# Patient Record
Sex: Male | Born: 1965 | Race: White | Hispanic: Yes | Marital: Married | Smoking: Never smoker
Health system: Southern US, Community
[De-identification: ages and names within clinical notes are randomized; demographics above are authoritative.]

## PROBLEM LIST (undated history)

## (undated) DIAGNOSIS — E785 Hyperlipidemia, unspecified: Secondary | ICD-10-CM

## (undated) HISTORY — PX: POLYPECTOMY: SHX149

## (undated) HISTORY — DX: Hyperlipidemia, unspecified: E78.5

## (undated) HISTORY — PX: SPINE SURGERY: SHX786

---

## 1999-03-28 HISTORY — PX: BACK SURGERY: SHX140

## 1999-04-20 ENCOUNTER — Encounter: Admission: RE | Admit: 1999-04-20 | Discharge: 1999-04-20 | Payer: Self-pay | Admitting: Family Medicine

## 1999-04-20 ENCOUNTER — Encounter: Payer: Self-pay | Admitting: Family Medicine

## 1999-09-08 ENCOUNTER — Encounter: Payer: Self-pay | Admitting: Neurosurgery

## 1999-09-12 ENCOUNTER — Inpatient Hospital Stay (HOSPITAL_COMMUNITY): Admission: RE | Admit: 1999-09-12 | Discharge: 1999-09-13 | Payer: Self-pay | Admitting: Neurosurgery

## 1999-09-12 ENCOUNTER — Encounter: Payer: Self-pay | Admitting: Neurosurgery

## 2003-03-28 HISTORY — PX: VASECTOMY: SHX75

## 2004-10-31 ENCOUNTER — Encounter: Admission: RE | Admit: 2004-10-31 | Discharge: 2004-10-31 | Payer: Self-pay | Admitting: Family Medicine

## 2005-10-20 ENCOUNTER — Ambulatory Visit: Payer: Self-pay | Admitting: Family Medicine

## 2005-10-26 ENCOUNTER — Ambulatory Visit: Payer: Self-pay

## 2005-10-26 ENCOUNTER — Encounter: Payer: Self-pay | Admitting: Cardiology

## 2006-04-06 ENCOUNTER — Ambulatory Visit: Payer: Self-pay

## 2006-04-06 ENCOUNTER — Ambulatory Visit: Payer: Self-pay | Admitting: Family Medicine

## 2006-06-08 ENCOUNTER — Ambulatory Visit: Payer: Self-pay | Admitting: Family Medicine

## 2007-06-20 ENCOUNTER — Ambulatory Visit: Payer: Self-pay | Admitting: Family Medicine

## 2008-06-23 ENCOUNTER — Ambulatory Visit: Payer: Self-pay | Admitting: Family Medicine

## 2008-06-30 ENCOUNTER — Ambulatory Visit: Payer: Self-pay | Admitting: Family Medicine

## 2010-01-10 ENCOUNTER — Ambulatory Visit: Payer: Self-pay | Admitting: Family Medicine

## 2010-08-12 NOTE — Op Note (Signed)
[place]. [facility]  Patient:    Gregory Ali, Gregory Ali                     MRN: [id number] Proc. Date: 09/12/99 Adm. Date:  [id number] Disc. Date: [id number] Attending:  Barton Fanny CC:         Hewitt Shorts, M.D.                           Operative Report  PREOPERATIVE DIAGNOSES:  Right L4-5 lumbar disk herniation, with spondylosis and degenerative disk disease.  POSTOPERATIVE DIAGNOSES:  Right L4-5 spondylosis and degenerative disk disease.  PROCEDURE:  Right L4-5 lumbar laminotomy and foraminotomy with microdiskectomy.  SURGEON:  Hewitt Shorts, M.D.  ASSISTANT:  Dr. Venetia Maxon.  ANESTHESIA:  General endotracheal.  INDICATIONS:  The patient is a 45 year old man who presented with a right lumbar radiculopathy who was found by MRI scan to have degenerative disk disease and spondylosis with what appeared to be a superimposed disc herniation.  The disc herniation migrated caudally below the L4-5 disc space level into the right L5-S1 foramen.  However, intraoperative findings revealed in fact that the disc was clearly degenerated and there was spondylosis and neurocompression but that an actual disc herniation was not present and therefore only laminotomy foraminotomy was performed for decompression.  DESCRIPTION OF PROCEDURE:  The patient was brought to the operating room and placed under general endotracheal anesthesia.  The patient was turned to a prone position and the lumbar region was prepped with Betadine soap solution, draped in a sterile fashion.  The midline was infiltrated with local anesthetic with epinephrine and local x-ray taken and the L4-5 level was identified.  A midline incision was made, carried down through the subcutaneous tissue.  Bipolar cautery and electrocautery used to maintain hemostasis.  Dissection was carried down to the lumbar fascia which was incised from the right side of the midline in the paraspinal muscle  with dissection of the spinous process and lamina in a subperiosteal fashion. Another x-ray was taken and the L4-5 interlaminar space identified.  The laminotomy was performed using the Midas Rex drill with an ______ bur and Kerrison punches.  The ligamentum flavum was removed and then the microscope was draped and brought into the field to provide identification, illumination and visualization and the remainder of the procedure was performed using microdissection technique.  We exposed the lateral aspect of the thecal sac and the right L5 nerve root. The laminotomy was carried caudally sufficiently to examine the axillae of the right L5 nerve root.  The disc space was identified, overlying epidural veins were coagulated.  The disc was clearly degenerated and bulging, but no actual disc fragment was found.  We examined the epidural space ventral to the thecal sac and ventral to the nerve root and extended our examination into the right L5-S1 foramen.  It was felt that the thecal sac and nerve root were well decompressed and that no disc herniation and in particular no free fragment was encountered.  Therefore, no discectomy was performed.  Once decompression was established and hemostasis was established we proceeded with closure.  Prior to closure though, 2 cc of fentanyl and a small graft of fat was placed in the laminotomy defect and then we proceeded with closure.  The deep fascia was closed with interrupted undyed 0 Vicryl sutures and the subcutaneous and subcuticular were closed with four interrupted and inverted 2-0  Vicryl sutures, then the skin edges were approximately with Dermabond. The patient tolerated the procedure well.  ESTIMATED BLOOD LOSS:  100 cc.  INSTRUMENT COUNTS:  Sponge and count were correct.  Following surgery, the patient was turned back to the supine position to be reversed from the anesthetic, extubated and was transferred to the recovery room for  further care. DD:  09/12/99 TD:  09/14/99 Job: [id number]

## 2010-08-12 NOTE — H&P (Signed)
[place]. [facility]  Patient:    Gregory Ali, Gregory Ali                     MRN: [id number] Adm. Date:  [id number] Attending:  Barton Fanny CC:         Hewitt Shorts, M.D.                         History and Physical  HISTORY OF PRESENT ILLNESS:  The patient is a 45 year old left-handed white male who was evaluated for a right lumbar radiculopathy. The patient injured himself the second week of January when he was exercising. He was doing a taebo workout and touched his right knee up to his left elbow and felt some pain in his low back. He found the pain extended from the right side of his low back into the right buttock. A couple of days later, he was feeling a bit better. He went out for a jog; and after that, the pain was much worse. He continued to have radicular pain down through the right lower extremity. He felt like his right lower extremity was dragging at times. He was treated by his primary physician with 12-day prednisone Dosepak. He felt it helped a bit, but he felt weakness in his toes and a sense of weakness in his right lower extremity and numbness and tingling in his right foot.  The patient was then seen by an orthopedist who started him on NSAIDs; and again, he improved some. At this time though, he has some weakness in his right foot. He is not taking any medications. He has discomfort particularly when sitting for a prolonged period of time or with sudden movement that will cause discomfort from the right side of his low back into the right buttock. He does do a lot of traveling with work and sitting in an airplane for a long flight tends to be quite uncomfortable.  MRI of the lumbar spine shows degenerative disk disease at L4-5 and L5-S1 with desiccation of the disk, more so at L4-5 then L5-S1. At L4-5, there is a right L4-5 disk herniation that has migrated caudally into the right L5-S1 foramen.  The patient is admitted now  for right L4-5 lumbar laminotomy and microdiskectomy.  PAST MEDICAL HISTORY:  No history of hypertension, myocardial infarction, cancer, stroke, diabetes, peptic ulcer disease, or lung disease.  PAST SURGICAL HISTORY:  No previous surgeries.  ALLERGIES:  No known allergies.  CURRENT MEDICATIONS:  He is not taking any medications.  FAMILY HISTORY:  His mother is in good health at age 61. His father is age 31 with hypertension.  SOCIAL HISTORY:  The patient is married. He works as a Database administrator. He does not smoke. He does drink alcoholic beverages socially. He denies history of substance abuse.  REVIEW OF SYSTEMS:  Notable for those difficulties described in the history of present of illness, past medical history, but is otherwise unremarkable.  PHYSICAL EXAMINATION:  GENERAL:  The patient is a well-developed, well-nourished white male in no acute distress.  VITAL SIGNS:  Temperature 97.3, pulse is 51, blood pressure 143/59, respiratory rate 16, height 5 feet 9 inches, weight 175 pounds.  LUNGS:  Clear to auscultation. He has symmetrical respiratory excursion.  HEART:  Regular rate and rhythm. Normal S1/S2. There is no murmur.  ABDOMEN:  Soft, nondistended. Bowel sounds are present.  EXTREMITIES:  No clubbing, cyanosis, or  edema.  MUSCULOSKELETAL:  Notable ______ lumbar spinous process or ______ musculature. He will flex 60 to 70 degrees, and at that point, he has discomfort that extends from the right side of his low back into his right buttock. He is able to extend without any discomfort. Staight-leg raising is negative on the left, positive on the right at 70 degrees with pain radiating down to the right lower extremity.  NEUROLOGICAL:  Shows strength in the left lower extremity is 5/5 including dorsiflexion, plantar flexion, extensor hallucis longus. However, the strength in the right lower extremity shows the dorsiflexion, plantar flexion to be  5/5 but the right extensor hallucis longus is 3/5. Sensation is intact to pinprick throughout the lower extremities and reflexes are 1 at the quadriceps and gastrocnemius and symmetrical bilaterally. Toes are downgoing bilaterally. He has normal gait and stance.  IMPRESSION:  Right lumbar radiculopathy ______ to a right L4-5 lumbar disk herniation that has migrated caudally into the L5-S1 foramen.  PLAN:  The patient will be admitted for a right L4-5 lumbar laminotomy, microdiskectomy. We discussed the nature of the surgical procedure, alternatives to surgery. ______ the surgery, hospital stay, and ______ his limitations soon in the postoperative period and risks are including risks of infection, bleeding, possible need for transfusion, the risk of nervous ______ pain, weakness, ______ paresthesias, risk of recurrent disk herniation, possible need for further surgery and risk of dural tear and CSF leaks and anesthetic risks of myocardial infarction, stroke, pneumonia, and death. Understanding all of this, he does wish to proceed with surgery and is admitted for such.DD:  09/12/99 TD:  09/12/99 Job: [id number]

## 2010-10-17 ENCOUNTER — Encounter: Payer: Self-pay | Admitting: Family Medicine

## 2010-10-17 ENCOUNTER — Ambulatory Visit (INDEPENDENT_AMBULATORY_CARE_PROVIDER_SITE_OTHER): Payer: BC Managed Care – PPO | Admitting: Family Medicine

## 2010-10-17 VITALS — BP 122/88 | HR 59 | Wt 169.0 lb

## 2010-10-17 DIAGNOSIS — S060X9A Concussion with loss of consciousness of unspecified duration, initial encounter: Secondary | ICD-10-CM

## 2010-10-17 DIAGNOSIS — S060X1A Concussion with loss of consciousness of 30 minutes or less, initial encounter: Secondary | ICD-10-CM
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
## 2010-10-17 NOTE — Patient Instructions (Signed)
He can use Tylenol for headache. If you're headache gets worse, you have continued vomiting or you lose track of who you are and where you're at, come back

## 2011-01-30 ENCOUNTER — Ambulatory Visit (INDEPENDENT_AMBULATORY_CARE_PROVIDER_SITE_OTHER): Payer: BC Managed Care – PPO | Admitting: Family Medicine

## 2011-01-30 ENCOUNTER — Encounter: Payer: Self-pay | Admitting: Family Medicine

## 2011-01-30 VITALS — BP 130/80 | HR 64 | Wt 172.0 lb

## 2011-01-30 DIAGNOSIS — IMO0002 Reserved for concepts with insufficient information to code with codable children: Secondary | ICD-10-CM

## 2011-01-30 DIAGNOSIS — S76219A Strain of adductor muscle, fascia and tendon of unspecified thigh, initial encounter: Secondary | ICD-10-CM

## 2011-01-30 NOTE — Patient Instructions (Signed)
Use heat to the area for 20 minutes 3 times a day. Advil or Tylenol as needed.

## 2011-01-30 NOTE — Progress Notes (Signed)
  Subjective:    Patient ID: Gregory Ali, male    DOB: 09/25/1965, 45 y.o.   MRN: [id number]  HPI Approximately one week ago while playing with his children he jumped up in the air and before he landed felt pain in the right inguinal area. Since then he has had intermittent pain especially with physical activities.   Review of Systems     Objective:   Physical Exam Alert and in no distress. Palpation of the right inguinal area shows no pain. No hernia present. Abductors are normal. He can reproduce the pain when he leans back and uses his lower abdominal muscles.       Assessment & Plan:  Inguinal strain Conservative care. Return here if symptoms continue.

## 2011-03-03 ENCOUNTER — Encounter: Payer: Self-pay | Admitting: Internal Medicine

## 2011-03-07 ENCOUNTER — Encounter: Payer: BC Managed Care – PPO | Admitting: Family Medicine

## 2011-03-10 ENCOUNTER — Ambulatory Visit (INDEPENDENT_AMBULATORY_CARE_PROVIDER_SITE_OTHER): Payer: BC Managed Care – PPO | Admitting: Family Medicine

## 2011-03-10 ENCOUNTER — Encounter: Payer: Self-pay | Admitting: Family Medicine

## 2011-03-10 VITALS — BP 110/74 | HR 60 | Ht 68.0 in | Wt 175.0 lb

## 2011-03-10 DIAGNOSIS — Z23 Encounter for immunization: Secondary | ICD-10-CM

## 2011-03-10 DIAGNOSIS — E785 Hyperlipidemia, unspecified: Secondary | ICD-10-CM | POA: Insufficient documentation

## 2011-03-10 DIAGNOSIS — Z0000 Encounter for general adult medical examination without abnormal findings: Secondary | ICD-10-CM

## 2011-03-10 DIAGNOSIS — J301 Allergic rhinitis due to pollen: Secondary | ICD-10-CM

## 2011-03-10 LAB — POC HEMOCCULT BLD/STL (OFFICE/1-CARD/DIAGNOSTIC): Fecal Occult Blood, POC: NEGATIVE

## 2011-03-10 NOTE — Progress Notes (Signed)
  Subjective:    Patient ID: Gregory Ali, male    DOB: 09/30/1965, 45 y.o.   MRN: [id number]  HPI He is here for a complete examination. He is having less right inguinal difficulty. He also notes a lesion in the upper mid abdominal area that tends to come and go. He has no other major concerns or complaints. He continues on medications listed in the chart. His work and home life are going well.   Review of Systems  Constitutional: Negative.   HENT: Negative.   Eyes: Negative.   Respiratory: Negative.   Cardiovascular: Negative.   Gastrointestinal: Negative.   Genitourinary: Negative.   Musculoskeletal: Negative.   Skin: Negative.   Neurological: Negative.   Hematological: Negative.   Psychiatric/Behavioral: Negative.        Objective:   Physical Exam BP 110/74  Pulse 60  Ht 5\' 8"  (1.727 m)  Wt 175 lb (79.379 kg)  BMI 26.61 kg/m2  General Appearance:    Alert, cooperative, no distress, appears stated age  Head:    Normocephalic, without obvious abnormality, atraumatic  Eyes:    PERRL, conjunctiva/corneas clear, EOM's intact, fundi    benign  Ears:    Normal TM's and external ear canals  Nose:   Nares normal, mucosa normal, no drainage or sinus   tenderness  Throat:   Lips, mucosa, and tongue normal; teeth and gums normal  Neck:   Supple, no lymphadenopathy;  thyroid:  no   enlargement/tenderness/nodules; no carotid   bruit or JVD  Back:    Spine nontender, no curvature, ROM normal, no CVA     tenderness  Lungs:     Clear to auscultation bilaterally without wheezes, rales or     ronchi; respirations unlabored  Chest Wall:    No tenderness or deformity   Heart:    Regular rate and rhythm, S1 and S2 normal, no murmur, rub   or gallop  Breast Exam:    No chest wall tenderness, masses or gynecomastia  Abdomen:     Soft, non-tender, nondistended, normoactive bowel sounds,    A 1 cm easily reduced lesion is noted in the linea alba superiorly., no hepatosplenomegaly    Genitalia:    Normal male external genitalia without lesions.  Testicles without masses.  No inguinal hernias.  Rectal:    Normal sphincter tone, no masses or tenderness; guaiac negative stool.  Prostate smooth, no nodules, not enlarged.  Extremities:   No clubbing, cyanosis or edema  Pulses:   2+ and symmetric all extremities  Skin:   Skin color, texture, turgor normal, no rashes or lesions  Lymph nodes:   Cervical, supraclavicular, and axillary nodes normal  Neurologic:   CNII-XII intact, normal strength, sensation and gait; reflexes 2+ and symmetric throughout          Psych:   Normal mood, affect, hygiene and grooming.           Assessment & Plan:   1. Routine general medical examination at a health care facility  CBC with Differential, Comprehensive metabolic panel, Lipid panel  2. Allergic rhinitis due to pollen    3. Hyperlipidemia LDL goal < 130  Lipid panel   small ventral hernia. No therapy needed at this time. Continue on present medication regimen. I will call with blood results.  flu shot given with consult concerning risk and benefit

## 2011-03-11 LAB — LIPID PANEL
HDL: 73 mg/dL (ref >39)
LDL Cholesterol: 164 mg/dL — ABNORMAL HIGH (ref 0–99)

## 2011-03-11 LAB — COMPREHENSIVE METABOLIC PANEL
ALT: 23 U/L (ref 0–53)
AST: 37 U/L (ref 0–37)
Albumin: 5.1 g/dL (ref 3.5–5.2)
Alkaline Phosphatase: 63 U/L (ref 39–117)
Chloride: 103 mEq/L (ref 96–112)
Potassium: 4.2 mEq/L (ref 3.5–5.3)
Sodium: 141 mEq/L (ref 135–145)
Total Protein: 7.2 g/dL (ref 6.0–8.3)

## 2011-03-11 LAB — CBC WITH DIFFERENTIAL/PLATELET
Basophils Absolute: 0 10*3/uL (ref 0.0–0.1)
Basophils Relative: 1 % (ref 0–1)
Lymphocytes Relative: 35 % (ref 12–46)
MCHC: 34 g/dL (ref 30.0–36.0)
Neutro Abs: 2.6 10*3/uL (ref 1.7–7.7)
Neutrophils Relative %: 53 % (ref 43–77)
RDW: 12.7 % (ref 11.5–15.5)
WBC: 5 10*3/uL (ref 4.0–10.5)

## 2011-03-13 NOTE — Progress Notes (Signed)
Copy of labs and diet info sent

## 2012-04-09 ENCOUNTER — Ambulatory Visit (INDEPENDENT_AMBULATORY_CARE_PROVIDER_SITE_OTHER): Payer: BC Managed Care – PPO | Admitting: Family Medicine

## 2012-04-09 ENCOUNTER — Encounter: Payer: Self-pay | Admitting: Family Medicine

## 2012-04-09 VITALS — BP 116/70 | HR 46 | Ht 68.0 in | Wt 172.0 lb

## 2012-04-09 DIAGNOSIS — Z0000 Encounter for general adult medical examination without abnormal findings: Secondary | ICD-10-CM

## 2012-04-09 DIAGNOSIS — E785 Hyperlipidemia, unspecified: Secondary | ICD-10-CM

## 2012-04-09 DIAGNOSIS — J301 Allergic rhinitis due to pollen: Secondary | ICD-10-CM

## 2012-04-09 LAB — POCT URINALYSIS DIPSTICK
Blood, UA: NEGATIVE
Glucose, UA: NEGATIVE
Leukocytes, UA: NEGATIVE
Nitrite, UA: NEGATIVE
Urobilinogen, UA: NEGATIVE

## 2012-04-09 LAB — HEMOCCULT GUIAC POC 1CARD (OFFICE)

## 2012-04-09 NOTE — Progress Notes (Signed)
  Subjective:    Patient ID: Gregory Ali, male    DOB: November 02, 1965, 47 y.o.   MRN: [id number]  HPI He is here for complete examination. He does have underlying allergies but these give him very little difficulty. He presently is taking red yeast rice for self treatment of his lipids. He continues to run on a regular basis. He has noted occasional difficulty with hemorrhoidal tissue however it is easily care for. He has no other concerns or complaints. Social and family history were reviewed. His work and home life are going quite well. He did have blood work done through his employment and apparently his lipid panel looked quite good.   Review of Systems  Constitutional: Negative.   HENT: Negative.   Eyes: Negative.   Respiratory: Negative.   Cardiovascular: Negative.   Gastrointestinal: Negative.   Genitourinary: Negative.   Musculoskeletal: Negative.   Skin: Negative.   Hematological: Negative.   Psychiatric/Behavioral: Negative.        Objective:   Physical Exam BP 116/70  Pulse 46  Ht 5\' 8"  (1.727 m)  Wt 172 lb (78.019 kg)  BMI 26.15 kg/m2  SpO2 99%  General Appearance:    Alert, cooperative, no distress, appears stated age  Head:    Normocephalic, without obvious abnormality, atraumatic  Eyes:    PERRL, conjunctiva/corneas clear, EOM's intact, fundi    benign  Ears:    Normal TM's and external ear canals  Nose:   Nares normal, mucosa normal, no drainage or sinus   tenderness  Throat:   Lips, mucosa, and tongue normal; teeth and gums normal  Neck:   Supple, no lymphadenopathy;  thyroid:  no   enlargement/tenderness/nodules; no carotid   bruit or JVD  Back:    Spine nontender, no curvature, ROM normal, no CVA     tenderness  Lungs:     Clear to auscultation bilaterally without wheezes, rales or     ronchi; respirations unlabored  Chest Wall:    No tenderness or deformity   Heart:    Regular rate and rhythm, S1 and S2 normal, no murmur, rub   or gallop  Breast Exam:     No chest wall tenderness, masses or gynecomastia  Abdomen:     Soft, non-tender, nondistended, normoactive bowel sounds,    no masses, no hepatosplenomegaly  Genitalia:    Normal male external genitalia without lesions.  Testicles without masses.  No inguinal hernias.  Rectal:    Normal sphincter tone, no masses or tenderness; guaiac negative stool.  Prostate smooth, no nodules, not enlarged.  Extremities:   No clubbing, cyanosis or edema  Pulses:   2+ and symmetric all extremities  Skin:   Skin color, texture, turgor normal, no rashes or lesions  Lymph nodes:   Cervical, supraclavicular, and axillary nodes normal  Neurologic:   CNII-XII intact, normal strength, sensation and gait; reflexes 2+ and symmetric throughout          Psych:   Normal mood, affect, hygiene and grooming.           Assessment & Plan:   1. Routine general medical examination at a health care facility  POCT Urinalysis Dipstick, Hemoccult - 1 Card (office)  2. Allergic rhinitis due to pollen    3. Hyperlipidemia LDL goal < 130     I encouraged him to continue to take excellent care of himself.

## 2012-10-29 ENCOUNTER — Ambulatory Visit (INDEPENDENT_AMBULATORY_CARE_PROVIDER_SITE_OTHER): Payer: BC Managed Care – PPO | Admitting: Family Medicine

## 2012-10-29 ENCOUNTER — Encounter: Payer: Self-pay | Admitting: Family Medicine

## 2012-10-29 VITALS — BP 116/68 | HR 52

## 2012-10-29 DIAGNOSIS — M7750 Other enthesopathy of unspecified foot: Secondary | ICD-10-CM

## 2012-10-29 DIAGNOSIS — M7741 Metatarsalgia, right foot: Secondary | ICD-10-CM

## 2012-10-29 NOTE — Patient Instructions (Signed)
Rest, ice and Advil or Aleve until your free of any discomfort then you can start back about half that you were before. Rule of thumb is easier out takes 2 to get back

## 2012-10-29 NOTE — Progress Notes (Signed)
  Subjective:    Patient ID: Gregory Ali, male    DOB: [birth year], 47 y.o.   MRN: [id number]  HPI Last night after finishing a runny on trails he noted pain on the dorsal surface of the right foot between the second and third toes. No history of injury. 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 He got up this morning and again noted the discomfort.   Review of Systems     Objective:   Physical Exam Slight swelling is noted over the second and third metatarsals dorsally. Good motion of the toes and foot. No point tenderness but slight discomfort over second and third distal metatarsals.       Assessment & Plan:  Metatarsalgia of right foot  discussed the fact that this is just one day of difficulty. Hard to say whether this is going to be anything significant. Recommended conservative care but also talked about the fact that he wanted to try to run a would not object. Rectal comfortable but he will not push the issue if he has discomfort.

## 2012-10-29 NOTE — Progress Notes (Signed)
oooo ?

## 2013-08-04 ENCOUNTER — Encounter: Payer: Self-pay | Admitting: Family Medicine

## 2013-08-04 ENCOUNTER — Ambulatory Visit (INDEPENDENT_AMBULATORY_CARE_PROVIDER_SITE_OTHER): Payer: BC Managed Care – PPO | Admitting: Family Medicine

## 2013-08-04 VITALS — BP 110/70 | HR 56 | Ht 69.0 in | Wt 172.0 lb

## 2013-08-04 DIAGNOSIS — J301 Allergic rhinitis due to pollen: Secondary | ICD-10-CM

## 2013-08-04 DIAGNOSIS — Z0000 Encounter for general adult medical examination without abnormal findings: Secondary | ICD-10-CM

## 2013-08-04 DIAGNOSIS — E785 Hyperlipidemia, unspecified: Secondary | ICD-10-CM

## 2013-08-04 LAB — COMPREHENSIVE METABOLIC PANEL
ALK PHOS: 56 U/L (ref 39–117)
ALT: 19 U/L (ref 0–53)
AST: 33 U/L (ref 0–37)
Albumin: 4.6 g/dL (ref 3.5–5.2)
BILIRUBIN TOTAL: 0.9 mg/dL (ref 0.2–1.2)
BUN: 17 mg/dL (ref 6–23)
CALCIUM: 9.9 mg/dL (ref 8.4–10.5)
CHLORIDE: 100 meq/L (ref 96–112)
CO2: 28 mEq/L (ref 19–32)
CREATININE: 1.03 mg/dL (ref 0.50–1.35)
Glucose, Bld: 77 mg/dL (ref 70–99)
Potassium: 4.1 mEq/L (ref 3.5–5.3)
Sodium: 138 mEq/L (ref 135–145)
Total Protein: 7.4 g/dL (ref 6.0–8.3)

## 2013-08-04 LAB — CBC WITH DIFFERENTIAL/PLATELET
BASOS ABS: 0 10*3/uL (ref 0.0–0.1)
BASOS PCT: 1 % (ref 0–1)
EOS ABS: 0 10*3/uL (ref 0.0–0.7)
EOS PCT: 1 % (ref 0–5)
HEMATOCRIT: 43.3 % (ref 39.0–52.0)
Hemoglobin: 15.2 g/dL (ref 13.0–17.0)
LYMPHS PCT: 30 % (ref 12–46)
Lymphs Abs: 1.3 10*3/uL (ref 0.7–4.0)
MCH: 32.2 pg (ref 26.0–34.0)
MCHC: 35.1 g/dL (ref 30.0–36.0)
MCV: 91.7 fL (ref 78.0–100.0)
MONO ABS: 0.3 10*3/uL (ref 0.1–1.0)
Monocytes Relative: 8 % (ref 3–12)
Neutro Abs: 2.5 10*3/uL (ref 1.7–7.7)
Neutrophils Relative %: 60 % (ref 43–77)
PLATELETS: 225 10*3/uL (ref 150–400)
RBC: 4.72 MIL/uL (ref 4.22–5.81)
RDW: 13.3 % (ref 11.5–15.5)
WBC: 4.2 10*3/uL (ref 4.0–10.5)

## 2013-08-04 LAB — POCT URINALYSIS DIPSTICK
Bilirubin, UA: NEGATIVE
Blood, UA: NEGATIVE
Glucose, UA: NEGATIVE
KETONES UA: NEGATIVE
Leukocytes, UA: NEGATIVE
Nitrite, UA: NEGATIVE
PH UA: 5
PROTEIN UA: NEGATIVE
UROBILINOGEN UA: NEGATIVE

## 2013-08-04 LAB — LIPID PANEL
CHOL/HDL RATIO: 3.4 ratio
Cholesterol: 268 mg/dL — ABNORMAL HIGH (ref 0–200)
HDL: 78 mg/dL (ref >39)
LDL Cholesterol: 167 mg/dL — ABNORMAL HIGH (ref 0–99)
Triglycerides: 113 mg/dL (ref <150)
VLDL: 23 mg/dL (ref 0–40)

## 2013-08-04 LAB — HEMOCCULT GUIAC POC 1CARD (OFFICE)

## 2013-08-04 NOTE — Progress Notes (Signed)
   Subjective:    Patient ID: Gregory Ali, Gregory Ali    DOB: 11/08/1965, 48 y.o.   MRN: [id number]  HPI He is here for complete examination. He continues to enjoy excellent health. He exercises regularly. He does have underlying allergies and is still getting shots. This has helped with his allergies. He has been using red yeast rice but recently stopped this stating he didn't think he was doing a good. He has no other concerns or complaints. Family and social history were reviewed. His work is going well. His home life is excellent.  Review of Systems  All other systems reviewed and are negative.      Objective:   Physical Exam BP 110/70  Pulse 56  Ht 5\' 9"  (1.753 m)  Wt 172 lb (78.019 kg)  BMI 25.39 kg/m2  General Appearance:    Alert, cooperative, no distress, appears stated age  Head:    Normocephalic, without obvious abnormality, atraumatic  Eyes:    PERRL, conjunctiva/corneas clear, EOM's intact, fundi    benign  Ears:    Normal TM's and external ear canals  Nose:   Nares normal, mucosa normal, no drainage or sinus   tenderness  Throat:   Lips, mucosa, and tongue normal; teeth and gums normal  Neck:   Supple, no lymphadenopathy;  thyroid:  no   enlargement/tenderness/nodules; no carotid   bruit or JVD  Back:    Spine nontender, no curvature, ROM normal, no CVA     tenderness  Lungs:     Clear to auscultation bilaterally without wheezes, rales or     ronchi; respirations unlabored  Chest Wall:    No tenderness or deformity   Heart:    Regular rate and rhythm, S1 and S2 normal, no murmur, rub   or gallop  Breast Exam:    No chest wall tenderness, masses or gynecomastia  Abdomen:     Soft, non-tender, nondistended, normoactive bowel sounds,    no masses, no hepatosplenomegaly  Genitalia:   deferred   Rectal:    Normal sphincter tone, no masses or tenderness; guaiac negative stool.  Prostate smooth, no nodules, not enlarged.  Extremities:   No clubbing, cyanosis or edema    Pulses:   2+ and symmetric all extremities  Skin:   Skin color, texture, turgor normal, no rashes or lesions  Lymph nodes:   Cervical, supraclavicular, and axillary nodes normal  Neurologic:   CNII-XII intact, normal strength, sensation and gait; reflexes 2+ and symmetric throughout          Psych:   Normal mood, affect, hygiene and grooming.          Assessment & Plan:  Routine general medical examination at a health care facility - Plan: Urinalysis Dipstick, CBC with Differential, Comprehensive metabolic panel, Lipid panel, POCT occult blood stool  Allergic rhinitis due to pollen  Hyperlipidemia LDL goal < 130 - Plan: Lipid panel  encouraged him to continue to take excellent care of himself.

## 2014-02-06 ENCOUNTER — Telehealth: Payer: Self-pay | Admitting: Internal Medicine

## 2014-02-06 DIAGNOSIS — Z800 Family history of malignant neoplasm of digestive organs: Secondary | ICD-10-CM

## 2014-02-06 NOTE — Telephone Encounter (Signed)
He called stating that all for his grandparents have had colon cancer. His sister also apparently has colonic polyps and is being reevaluated every 5 years. He would like to be scheduled for colonoscopy.I cautioned him to make sure that his insurance will cover this.

## 2014-02-06 NOTE — Telephone Encounter (Signed)
Pt states that he would like a colonoscopy done. He knows he is not 51 yet but has a family history of colon cancer and he has checked with his insurance and they will pay for it.

## 2014-02-18 ENCOUNTER — Encounter: Payer: Self-pay | Admitting: Gastroenterology

## 2014-02-18 ENCOUNTER — Encounter: Payer: Self-pay | Admitting: Internal Medicine

## 2014-05-04 ENCOUNTER — Ambulatory Visit (AMBULATORY_SURGERY_CENTER): Payer: Self-pay | Admitting: *Deleted

## 2014-05-04 VITALS — Ht 69.0 in | Wt 173.0 lb

## 2014-05-04 DIAGNOSIS — Z800 Family history of malignant neoplasm of digestive organs: Secondary | ICD-10-CM

## 2014-05-04 MED ORDER — NA SULFATE-K SULFATE-MG SULF 17.5-3.13-1.6 GM/177ML PO SOLN: ORAL | Status: DC

## 2014-05-04 NOTE — Progress Notes (Signed)
Patient denies any allergies to eggs or soy. Patient denies any problems with anesthesia/sedation. Patient denies any oxygen use at home and does not take any diet/weight loss medications. EMMI education assisgned to patient on colonoscopy, this was explained and instructions given to patient. 

## 2014-05-18 ENCOUNTER — Ambulatory Visit (AMBULATORY_SURGERY_CENTER): Payer: BLUE CROSS/BLUE SHIELD | Admitting: Gastroenterology

## 2014-05-18 ENCOUNTER — Encounter: Payer: Self-pay | Admitting: Gastroenterology

## 2014-05-18 VITALS — BP 96/68 | HR 45 | Temp 96.8°F | Resp 24 | Ht 69.0 in | Wt 172.0 lb

## 2014-05-18 DIAGNOSIS — Z8371 Family history of colonic polyps: Secondary | ICD-10-CM

## 2014-05-18 DIAGNOSIS — Z800 Family history of malignant neoplasm of digestive organs: Secondary | ICD-10-CM

## 2014-05-18 HISTORY — PX: COLONOSCOPY: SHX174

## 2014-05-18 MED ORDER — SODIUM CHLORIDE 0.9 % IV SOLN: 500.0000 mL | INTRAVENOUS | Status: DC

## 2014-05-18 NOTE — Patient Instructions (Signed)
Hemorrhoids seen today, handouts given. [facility] handout given also. Repeat colonoscopy in 5 years. Call us with any questions or concerns. Thank you!   YOU HAD AN ENDOSCOPIC PROCEDURE TODAY AT THE [facility]: Refer to the procedure report that was given to you for any specific questions about what was found during the examination.  If the procedure report does not answer your questions, please call your gastroenterologist to clarify.  If you requested that your care partner not be given the details of your procedure findings, then the procedure report has been included in a sealed envelope for you to review at your convenience later.  YOU SHOULD EXPECT: Some feelings of bloating in the abdomen. Passage of more gas than usual.  Walking can help get rid of the air that was put into your GI tract during the procedure and reduce the bloating. If you had a lower endoscopy (such as a colonoscopy or flexible sigmoidoscopy) you may notice spotting of blood in your stool or on the toilet paper. If you underwent a bowel prep for your procedure, then you may not have a normal bowel movement for a few days.  DIET: Your first meal following the procedure should be a light meal and then it is ok to progress to your normal diet.  A half-sandwich or bowl of soup is an example of a good first meal.  Heavy or fried foods are harder to digest and may make you feel nauseous or bloated.  Likewise meals heavy in dairy and vegetables can cause extra gas to form and this can also increase the bloating.  Drink plenty of fluids but you should avoid alcoholic beverages for 24 hours.  ACTIVITY: Your care partner should take you home directly after the procedure.  You should plan to take it easy, moving slowly for the rest of the day.  You can resume normal activity the day after the procedure however you should NOT DRIVE or use heavy machinery for 24 hours (because of the sedation medicines used during the test).     SYMPTOMS TO REPORT IMMEDIATELY: A gastroenterologist can be reached at any hour.  During normal business hours, 8:30 AM to 5:00 PM Monday through Friday, call [phone].  After hours and on weekends, please call the GI answering service at [phone] who will take a message and have the physician on call contact you.   Following lower endoscopy (colonoscopy or flexible sigmoidoscopy):  Excessive amounts of blood in the stool  Significant tenderness or worsening of abdominal pains  Swelling of the abdomen that is new, acute  Fever of 100F or higher  Following upper endoscopy (EGD)  Vomiting of blood or coffee ground material  New chest pain or pain under the shoulder blades  Painful or persistently difficult swallowing  New shortness of breath  Fever of 100F or higher  Black, tarry-looking stools  FOLLOW UP: If any biopsies were taken you will be contacted by phone or by letter within the next 1-3 weeks.  Call your gastroenterologist if you have not heard about the biopsies in 3 weeks.  Our staff will call the home number listed on your records the next business day following your procedure to check on you and address any questions or concerns that you may have at that time regarding the information given to you following your procedure. This is a courtesy call and so if there is no answer at the home number and we have not heard from you through  the emergency physician on call, we will assume that you have returned to your regular daily activities without incident.  SIGNATURES/CONFIDENTIALITY: You and/or your care partner have signed paperwork which will be entered into your electronic medical record.  These signatures attest to the fact that that the information above on your After Visit Summary has been reviewed and is understood.  Full responsibility of the confidentiality of this discharge information lies with you and/or your care-partner.

## 2014-05-18 NOTE — Progress Notes (Signed)
Procedure ends, to recovery, report given and VSS. 

## 2014-05-18 NOTE — Op Note (Signed)
[facility] [address] [place], [id number]   COLONOSCOPY PROCEDURE REPORT  PATIENT: Gregory Ali, Gregory Ali  MR#: [id number] BIRTHDATE: Dec 20, 1965 , 48  yrs. old GENDER: male ENDOSCOPIST: Louis Meckelobert D Menachem Urbanek, MD REFERRED QI:ONGEBY:Gregory Ali, M.D. PROCEDURE DATE:  05/18/2014 PROCEDURE:   Colonoscopy, diagnostic First Screening Colonoscopy - Avg.  risk and is 50 yrs.  old or older Yes.  Prior Negative Screening - Now for repeat screening. N/A  History of Adenoma - Now for follow-up colonoscopy & has been > or = to 3 yrs.  N/A  Polyps Removed Today? No.  Recommend repeat exam, <10 yrs? Yes.  High risk (family or personal hx). ASA CLASS:   Class I INDICATIONS:patient's family history of colon cancer, distant relatives and patient's family history of colon polyps. Both maternal and paternal grandparents with colon cancer, mother and sibling with colon polyps MEDICATIONS: Monitored anesthesia care and Propofol 200 mg IV  DESCRIPTION OF PROCEDURE:   After the risks benefits and alternatives of the procedure were thoroughly explained, informed consent was obtained.  The digital rectal exam revealed no abnormalities of the rectum.   The LB PFC-H190 [id number]  endoscope was introduced through the anus and advanced to the cecum, which was identified by both the appendix and ileocecal valve. No adverse events experienced.   The quality of the prep was excellent using Suprep  The instrument was then slowly withdrawn as the colon was fully examined.      COLON FINDINGS: Large internal hemorrhoids were found.   The examination was otherwise normal.  Retroflexed views revealed no abnormalities. The time to cecum=2 minutes 26 seconds.  Withdrawal time=8 minutes 27 seconds.  The scope was withdrawn and the procedure completed. COMPLICATIONS: There were no immediate complications.  ENDOSCOPIC IMPRESSION: 1.   Large internal hemorrhoids 2.   The examination was otherwise  normal  RECOMMENDATIONS: Given your significant family history of colon cancer, you should have a repeat colonoscopy in 5 years  eSigned:  Louis Meckelobert D Jontrell Bushong, MD 05/18/2014 9:20 AM   cc:

## 2014-05-19 ENCOUNTER — Telehealth: Payer: Self-pay | Admitting: *Deleted

## 2014-05-19 NOTE — Telephone Encounter (Signed)
  Follow up Call-  Call back number 05/18/2014  Post procedure Call Back phone  # [phone]  Permission to leave phone message Yes     Patient questions:  Do you have a fever, pain , or abdominal swelling? No. Pain Score  0 *  Have you tolerated food without any problems? Yes.    Have you been able to return to your normal activities? Yes.    Do you have any questions about your discharge instructions: Diet   No. Medications  No. Follow up visit  No.  Do you have questions or concerns about your Care? No.  Actions: * If pain score is 4 or above: No action needed, pain <4.

## 2015-03-01 ENCOUNTER — Encounter: Payer: Self-pay | Admitting: Family Medicine

## 2015-03-01 ENCOUNTER — Ambulatory Visit (INDEPENDENT_AMBULATORY_CARE_PROVIDER_SITE_OTHER): Payer: BLUE CROSS/BLUE SHIELD | Admitting: Family Medicine

## 2015-03-01 VITALS — Ht 69.0 in | Wt 173.0 lb

## 2015-03-01 DIAGNOSIS — J301 Allergic rhinitis due to pollen: Secondary | ICD-10-CM

## 2015-03-01 DIAGNOSIS — Z8371 Family history of colonic polyps: Secondary | ICD-10-CM | POA: Diagnosis not present

## 2015-03-01 DIAGNOSIS — Z0000 Encounter for general adult medical examination without abnormal findings: Secondary | ICD-10-CM

## 2015-03-01 DIAGNOSIS — B070 Plantar wart: Secondary | ICD-10-CM

## 2015-03-01 DIAGNOSIS — E785 Hyperlipidemia, unspecified: Secondary | ICD-10-CM

## 2015-03-01 LAB — CBC WITH DIFFERENTIAL/PLATELET
BASOS ABS: 0 10*3/uL (ref 0.0–0.1)
Basophils Relative: 1 % (ref 0–1)
EOS PCT: 1 % (ref 0–5)
Eosinophils Absolute: 0 10*3/uL (ref 0.0–0.7)
HEMATOCRIT: 42.8 % (ref 39.0–52.0)
Hemoglobin: 15.1 g/dL (ref 13.0–17.0)
LYMPHS PCT: 41 % (ref 12–46)
Lymphs Abs: 1.5 10*3/uL (ref 0.7–4.0)
MCH: 32.7 pg (ref 26.0–34.0)
MCHC: 35.3 g/dL (ref 30.0–36.0)
MCV: 92.6 fL (ref 78.0–100.0)
MPV: 10.1 fL (ref 8.6–12.4)
Monocytes Absolute: 0.3 10*3/uL (ref 0.1–1.0)
Monocytes Relative: 7 % (ref 3–12)
NEUTROS PCT: 50 % (ref 43–77)
Neutro Abs: 1.9 10*3/uL (ref 1.7–7.7)
Platelets: 228 10*3/uL (ref 150–400)
RBC: 4.62 MIL/uL (ref 4.22–5.81)
RDW: 13.7 % (ref 11.5–15.5)
WBC: 3.7 10*3/uL — ABNORMAL LOW (ref 4.0–10.5)

## 2015-03-01 LAB — COMPREHENSIVE METABOLIC PANEL
ALK PHOS: 53 U/L (ref 40–115)
ALT: 18 U/L (ref 9–46)
AST: 27 U/L (ref 10–40)
Albumin: 4.7 g/dL (ref 3.6–5.1)
BUN: 15 mg/dL (ref 7–25)
CALCIUM: 9.5 mg/dL (ref 8.6–10.3)
CO2: 30 mmol/L (ref 20–31)
Chloride: 100 mmol/L (ref 98–110)
Creat: 0.93 mg/dL (ref 0.60–1.35)
Glucose, Bld: 81 mg/dL (ref 65–99)
Potassium: 4.4 mmol/L (ref 3.5–5.3)
Sodium: 138 mmol/L (ref 135–146)
TOTAL PROTEIN: 6.9 g/dL (ref 6.1–8.1)
Total Bilirubin: 0.9 mg/dL (ref 0.2–1.2)

## 2015-03-01 LAB — LIPID PANEL
CHOL/HDL RATIO: 2.9 ratio (ref <=5.0)
CHOLESTEROL: 268 mg/dL — AB (ref 125–200)
HDL: 91 mg/dL (ref >=40)
LDL Cholesterol: 165 mg/dL — ABNORMAL HIGH (ref <130)
TRIGLYCERIDES: 61 mg/dL (ref <150)
VLDL: 12 mg/dL (ref <30)

## 2015-03-01 NOTE — Progress Notes (Signed)
   Subjective:    Patient ID: Gregory Ali, male    DOB: 1965-10-25, 49 y.o.   MRN: [id number]  HPI He is here for complete examination. He does have underlying allergies and is getting monthly shots. His allergies seem to be under good control. He has cats and family has Allergies. He also has a history of hyperlipidemia and a family history of colonic polyps. He has had a colonoscopy and is on a 5 year schedule. He also has a wart present on the right foot that does interfere with his running. Otherwise he has no particular concerns or complaints. No chest pain, shortness of breath, abdominal issues. His family and social history as well as health maintenance and immunizations were reviewed.   Review of Systems  All other systems reviewed and are negative.      Objective:   Physical Exam Ht 5\' 9"  (1.753 m)  Wt 173 lb (78.472 kg)  BMI 25.54 kg/m2  General Appearance:    Alert, cooperative, no distress, appears stated age  Head:    Normocephalic, without obvious abnormality, atraumatic  Eyes:    PERRL, conjunctiva/corneas clear  EOM's intact, fundi    benign  Ears:    Normal TM's and external ear canals  Nose:   Nares normal, mucosa normal, no drainage or sinus   tenderness  Throat:   Lips, mucosa, and tongue normal; teeth and gums normal  Neck:   Supple, no lymphadenopathy;  thyroid:  no   enlargement/tenderness/nodules; no carotid   bruit or JVD  Back:    Spine nontender, no curvature, ROM normal, no CVA     tenderness  Lungs:     Clear to auscultation bilaterally without wheezes, rales or     ronchi; respirations unlabored  Chest Wall:    No tenderness or deformity   Heart:    Regular rate and rhythm, S1 and S2 normal, no murmur, rub   or gallop  Breast Exam:    No chest wall tenderness, masses or gynecomastia  Abdomen:     Soft, non-tender, nondistended, normoactive bowel sounds,    no masses, no hepatosplenomegaly        Extremities:   No clubbing, cyanosis or edema    Pulses:   2+ and symmetric all extremities  Skin:   Skin color, texture, turgor normal, no rashes or lesions  Lymph nodes:   Cervical, supraclavicular, and axillary nodes normal  Neurologic:   CNII-XII intact, normal strength, sensation and gait; reflexes 2+ and symmetric throughout          Psych:   Normal mood, affect, hygiene and grooming.          Assessment & Plan:  Routine general medical examination at a health care facility - Plan: CBC with Differential/Platelet, Comprehensive metabolic panel, Lipid panel  Non-seasonal allergic rhinitis due to pollen  Hyperlipidemia with target LDL less than 130 - Plan: Lipid panel  Family history of colonic polyps  Plantar wart of right foot discussed proper care for his plantar work. Recommended using salicylic acid and slowly shaving off the dead tissue. He will return here when this thing shrink some low bit and the might possibly get more aggressive with it.,

## 2015-03-01 NOTE — Patient Instructions (Signed)
See if you can shrink the warts by using Compound W or the patches and when they get smaller than I can either freeze them or burned them

## 2016-01-27 ENCOUNTER — Other Ambulatory Visit: Payer: Self-pay | Admitting: Orthopedic Surgery

## 2016-01-27 DIAGNOSIS — R609 Edema, unspecified: Secondary | ICD-10-CM

## 2016-01-27 DIAGNOSIS — R52 Pain, unspecified: Secondary | ICD-10-CM

## 2016-01-27 DIAGNOSIS — M2391 Unspecified internal derangement of right knee: Secondary | ICD-10-CM

## 2016-02-02 ENCOUNTER — Ambulatory Visit
Admission: RE | Admit: 2016-02-02 | Discharge: 2016-02-02 | Disposition: A | Discharge status: Home / Self Care | Payer: Managed Care, Other (non HMO) | Source: Ambulatory Visit | Attending: Orthopedic Surgery | Admitting: Orthopedic Surgery

## 2016-02-02 DIAGNOSIS — M2391 Unspecified internal derangement of right knee: Secondary | ICD-10-CM

## 2016-02-02 DIAGNOSIS — R52 Pain, unspecified: Secondary | ICD-10-CM

## 2016-02-02 DIAGNOSIS — R609 Edema, unspecified: Secondary | ICD-10-CM

## 2017-11-07 ENCOUNTER — Encounter: Payer: Self-pay | Admitting: Family Medicine

## 2018-01-24 IMAGING — MR MR KNEE*R* W/O CM
4 of 5 series · 31 of 40 positions shown · non-contrast
Comparison: None.

CLINICAL DATA: Medial right knee pain for several years. Remote
history of prior injuries playing football.

EXAM:
MRI OF THE RIGHT KNEE WITHOUT CONTRAST
TECHNIQUE: Multiplanar, multisequence MR imaging of the knee was performed. No
intravenous contrast was administered.

[Series 6: PD fat-sat · axial · right · 3.0mm · 0.39mm/px · z∈[-33,+82]mm · 8 of 36 slices shown (1 of 3)]
[im 1/36]
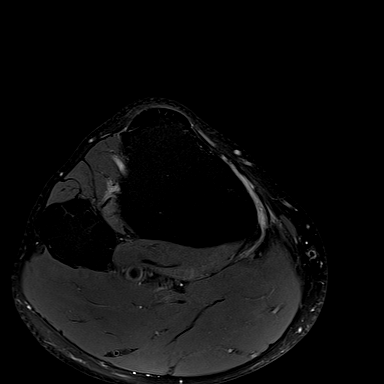
[im 6/36]
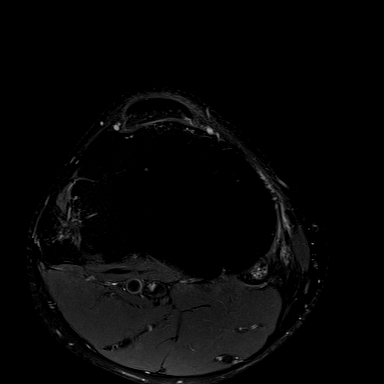
[im 11/36]
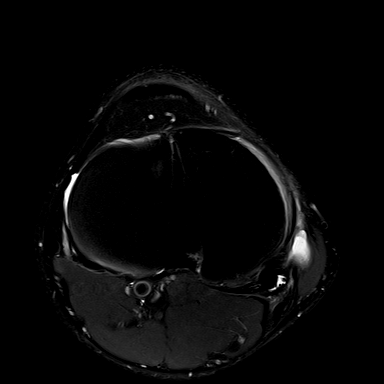
[im 16/36]
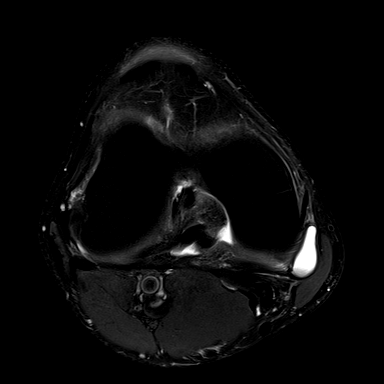
[im 21/36]
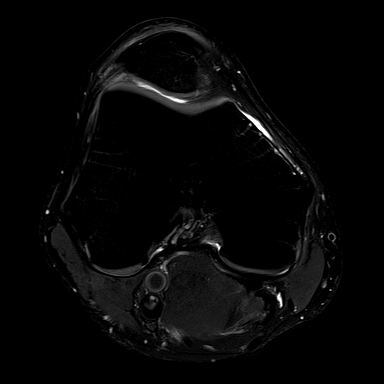
[im 26/36]
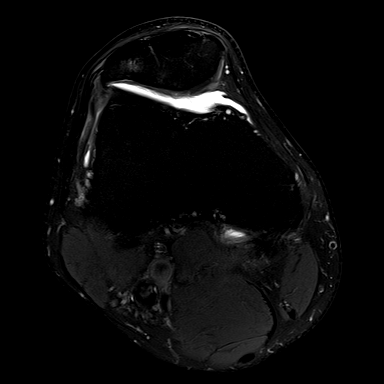
[im 31/36]
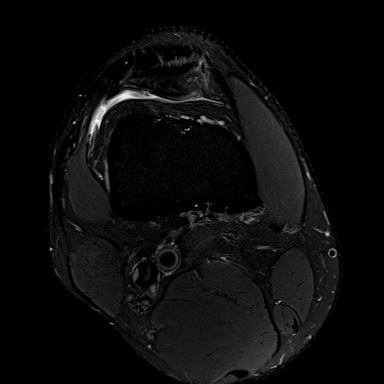
[im 36/36]
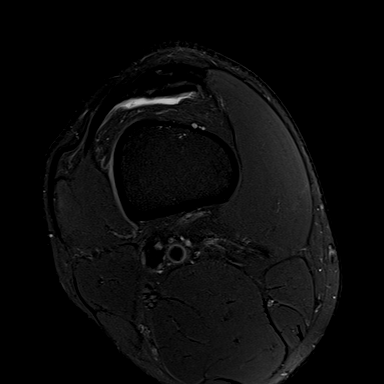

[Series 8: PD fat-sat · coronal · right · 3.0mm · 0.33mm/px · 8 of 33 slices shown (2 of 3)]
[im 1/33]
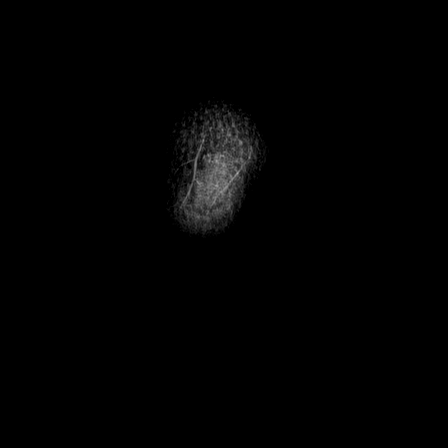
[im 5/33]
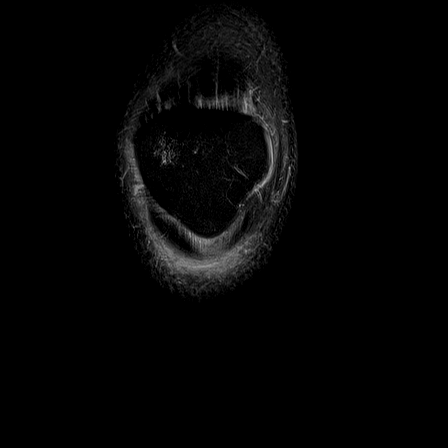
[im 10/33]
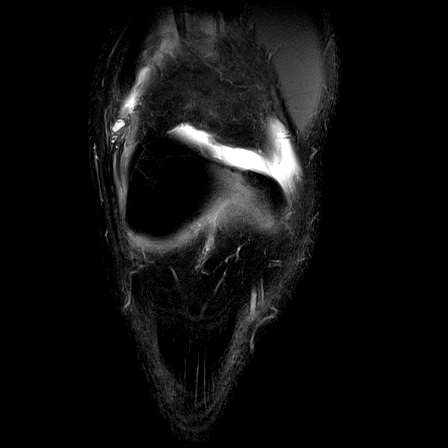
[im 14/33]
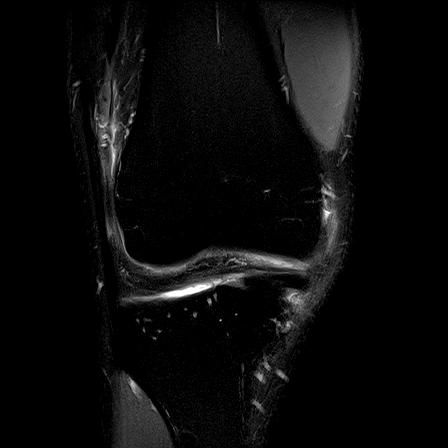
[im 19/33]
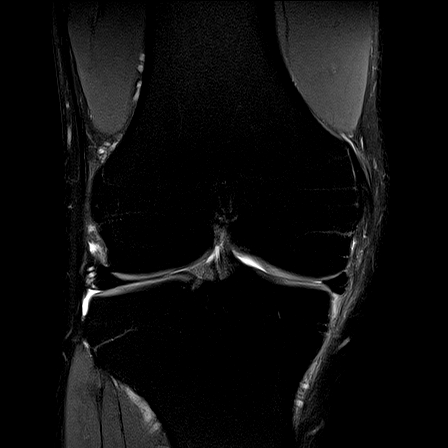
[im 23/33]
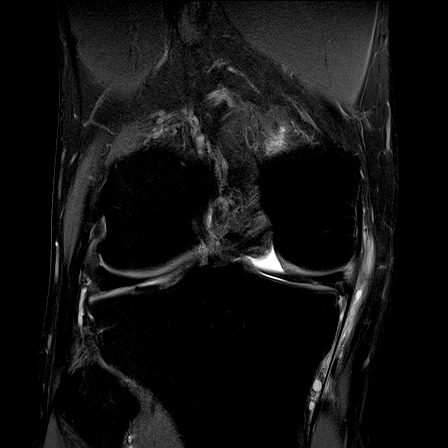
[im 28/33]
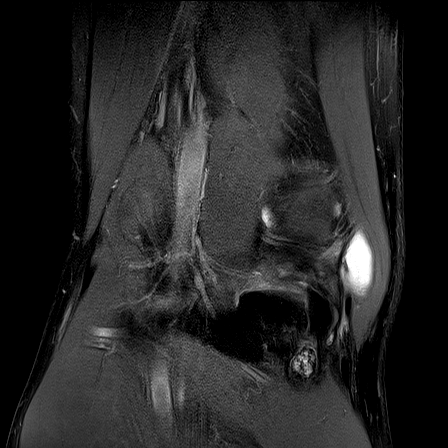
[im 33/33]
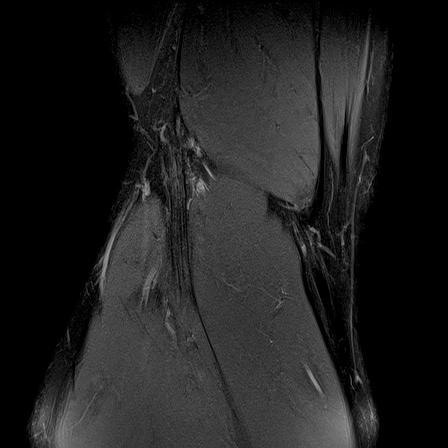

[Series 9: PD fat-sat · sagittal · right · 3.0mm · 0.39mm/px · 8 of 32 slices shown (3 of 3)]
[im 1/32]
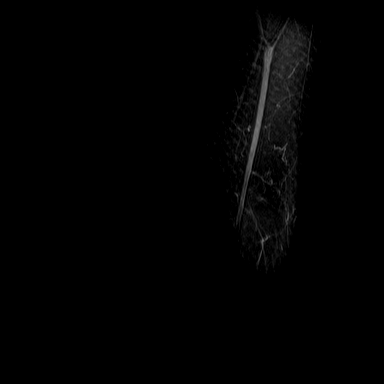
[im 5/32]
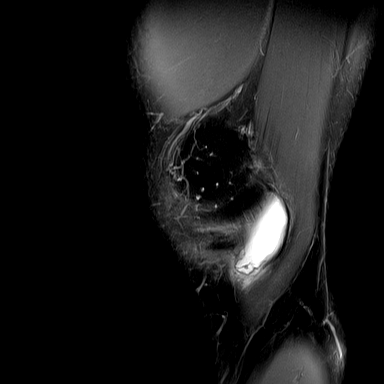
[im 9/32]
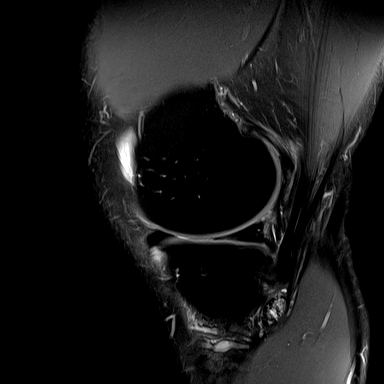
[im 14/32]
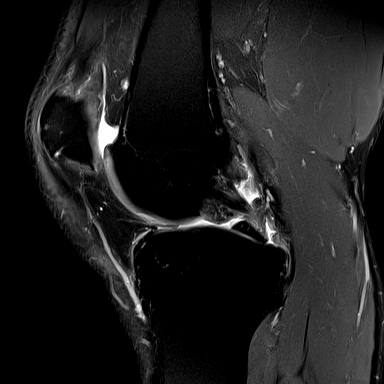
[im 18/32]
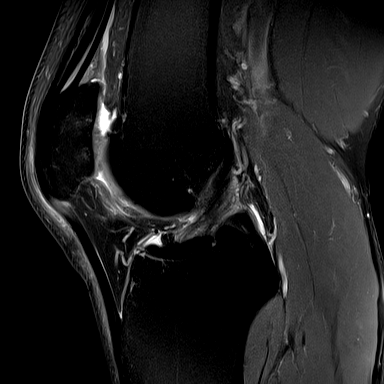
[im 23/32]
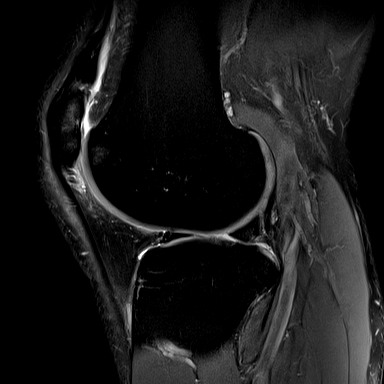
[im 27/32]
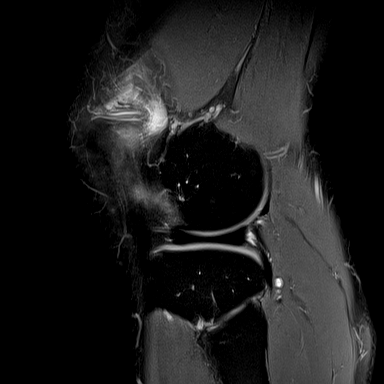
[im 32/32]
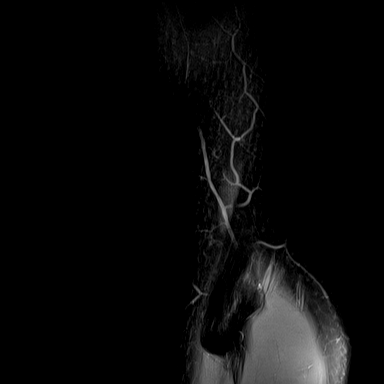

[Series 10: T2 fat-sat · coronal · right · 3.0mm · 0.39mm/px · 7 of 33 slices shown]
[im 1/33]
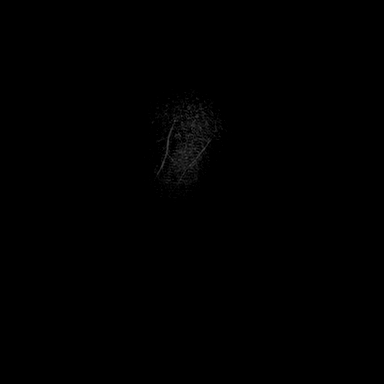
[im 5/33]
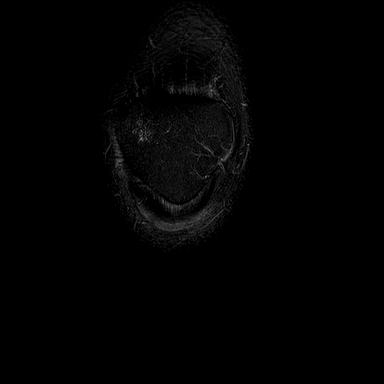
[im 10/33]
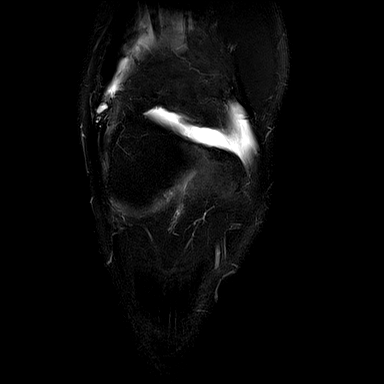
[im 14/33]
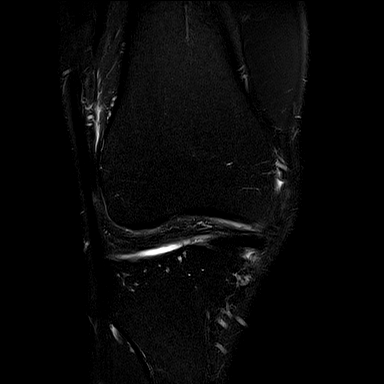
[im 19/33]
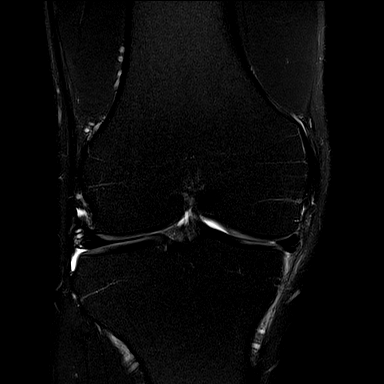
[im 23/33]
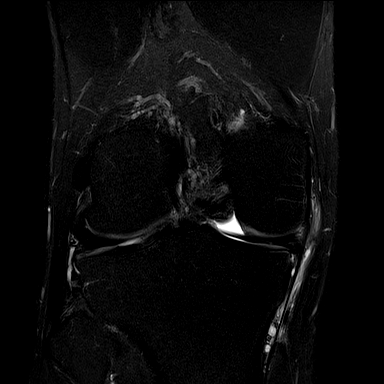
[im 28/33]
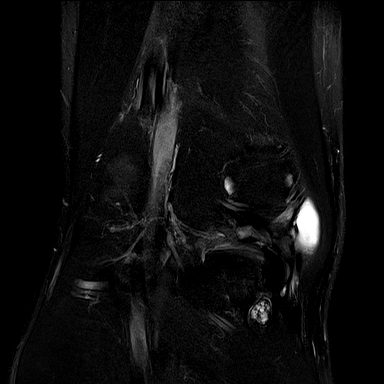

[31 of 40 positions shown; findings below may reference images not displayed]

FINDINGS: MENISCI

Medial meniscus: A horizontal tear in the periphery of the posterior
horn reaches the meniscal undersurface. Associated parameniscal cyst
off the junction of the posterior horn and body measures 2.7 cm
craniocaudal by 1.6 cm transverse by 2.0 cm AP. No displaced
fragment.

Lateral meniscus:  Intact.

LIGAMENTS

Cruciates:  Intact.

Collaterals:  Intact.

CARTILAGE

Patellofemoral: Multiple fissures are identified with marked
thinning along the lateral patellar facet.

Medial: Small fissure in cartilage is seen in the central
weight-bearing surface.

Lateral:  Mildly thinned along the tibial plateau.

Joint:  Small joint effusion.

Popliteal Fossa:  No Baker's cyst.

Extensor Mechanism: Intact. Intrasubstance increased T2 signal is
seen in the central fibers of the superior patellar tendon at its
attachment to the inferior pole the patella consistent with
tendinosis without tear.

Bones:  No fracture or worrisome lesion.

Other: A rounded calcification measuring 0.9 cm AP by 0.9 cm
transverse by 1.2 cm craniocaudal is seen in the distal
semimembranosus tendon. This is of doubtful clinical significance
and could be due to remote trauma. The tendon is intact.
IMPRESSION: Horizontal tear in the periphery of the posterior horn of the medial
meniscus reaches the meniscal undersurface. Associated large
parameniscal cyst off the junction of the posterior horn and body is
identified.

Mild tendinopathy of the superior fibers the patellar tendon without
tear.

Mild to moderate osteoarthritis most notable in the patellofemoral
compartment.

## 2018-01-28 ENCOUNTER — Ambulatory Visit (INDEPENDENT_AMBULATORY_CARE_PROVIDER_SITE_OTHER): Payer: 59 | Admitting: Family Medicine

## 2018-01-28 ENCOUNTER — Encounter: Payer: Self-pay | Admitting: Family Medicine

## 2018-01-28 VITALS — BP 104/70 | HR 58 | Temp 97.8°F | Ht 69.0 in | Wt 174.0 lb

## 2018-01-28 DIAGNOSIS — Z0000 Encounter for general adult medical examination without abnormal findings: Secondary | ICD-10-CM

## 2018-01-28 DIAGNOSIS — E785 Hyperlipidemia, unspecified: Secondary | ICD-10-CM

## 2018-01-28 DIAGNOSIS — J301 Allergic rhinitis due to pollen: Secondary | ICD-10-CM

## 2018-01-28 DIAGNOSIS — Z8371 Family history of colonic polyps: Secondary | ICD-10-CM | POA: Diagnosis not present

## 2018-01-28 LAB — POCT URINALYSIS DIP (PROADVANTAGE DEVICE)
BILIRUBIN UA: NEGATIVE mg/dL
Bilirubin, UA: NEGATIVE
Blood, UA: NEGATIVE
Glucose, UA: NEGATIVE mg/dL
LEUKOCYTES UA: NEGATIVE
NITRITE UA: NEGATIVE
PH UA: 6 (ref 5.0–8.0)
PROTEIN UA: NEGATIVE mg/dL
Specific Gravity, Urine: 1.015
UUROB: 3.5

## 2018-01-28 NOTE — Addendum Note (Signed)
Addended by: Renelda Loma on: 01/28/2018 03:47 PM   Modules accepted: Orders

## 2018-01-28 NOTE — Progress Notes (Signed)
   Subjective:    Patient ID: Gregory Ali, male    DOB: January 26, 1966, 52 y.o.   MRN: [id number]  HPI He is here for complete examination.  He has had some previous history in the past with needing arthroscopy on the right knee has also had back surgery but at present time seems to be doing well taking care of himself.  He did injure his left lower abdominal musculature recently and doing some abdominal exercises.  Does have a family history of colon cancer and is scheduled for another colonoscopy in 2021.  His allergies are not causing him any difficulty.  Work and home life are going quite well.  Family and social history as well as health maintenance and immunizations was reviewed   Review of Systems  All other systems reviewed and are negative.      Objective:   Physical Exam  BP 104/70 (BP Location: Left Arm, Patient Position: Sitting)   Pulse (!) 58   Temp 97.8 F (36.6 C)   Ht 5\' 9"  (1.753 m)   Wt 174 lb (78.9 kg)   SpO2 98%   BMI 25.70 kg/m   General Appearance:    Alert, cooperative, no distress, appears stated age  Head:    Normocephalic, without obvious abnormality, atraumatic  Eyes:    PERRL, conjunctiva/corneas clear, EOM's intact, fundi    benign  Ears:    Normal TM's and external ear canals  Nose:   Nares normal, mucosa normal, no drainage or sinus   tenderness  Throat:   Lips, mucosa, and tongue normal; teeth and gums normal  Neck:   Supple, no lymphadenopathy;  thyroid:  no   enlargement/tenderness/nodules; no carotid   bruit or JVD     Lungs:     Clear to auscultation bilaterally without wheezes, rales or     ronchi; respirations unlabored      Heart:    Regular rate and rhythm, S1 and S2 normal, no murmur, rub   or gallop     Abdomen:     Soft, non-tender, nondistended, normoactive bowel sounds,    no masses, no hepatosplenomegaly  Genitalia:      No inguinal hernias.  Rectal:   Deferred.  Extremities:   No clubbing, cyanosis or edema  Pulses:   2+ and  symmetric all extremities  Skin:   Skin color, texture, turgor normal, no rashes or lesions  Lymph nodes:   Cervical, supraclavicular, and axillary nodes normal  Neurologic:   CNII-XII intact, normal strength, sensation and gait; reflexes 2+ and symmetric throughout          Psych:   Normal mood, affect, hygiene and grooming.           Assessment & Plan:  Routine general medical examination at a health care facility - Plan: CBC with Differential/Platelet, Comprehensive metabolic panel, Lipid panel  Non-seasonal allergic rhinitis due to pollen  Family history of colonic polyps  Hyperlipidemia with target LDL less than 130 - Plan: Lipid panel He probably strained his abdominal musculature as I do feel nothing on exam. I encouraged him to continue to take good care of himself.  Flu shot offered although he refused.

## 2018-01-29 LAB — CBC WITH DIFFERENTIAL/PLATELET
BASOS: 1 %
Basophils Absolute: 0 10*3/uL (ref 0.0–0.2)
EOS (ABSOLUTE): 0.1 10*3/uL (ref 0.0–0.4)
Eos: 2 %
HEMOGLOBIN: 16.2 g/dL (ref 13.0–17.7)
Hematocrit: 46.4 % (ref 37.5–51.0)
IMMATURE GRANS (ABS): 0 10*3/uL (ref 0.0–0.1)
Immature Granulocytes: 1 %
LYMPHS ABS: 1.4 10*3/uL (ref 0.7–3.1)
LYMPHS: 38 %
MCH: 31.6 pg (ref 26.6–33.0)
MCHC: 34.9 g/dL (ref 31.5–35.7)
MCV: 90 fL (ref 79–97)
Monocytes Absolute: 0.3 10*3/uL (ref 0.1–0.9)
Monocytes: 8 %
NEUTROS ABS: 2 10*3/uL (ref 1.4–7.0)
Neutrophils: 50 %
Platelets: 270 10*3/uL (ref 150–450)
RBC: 5.13 x10E6/uL (ref 4.14–5.80)
RDW: 12.4 % (ref 12.3–15.4)
WBC: 3.8 10*3/uL (ref 3.4–10.8)

## 2018-01-29 LAB — COMPREHENSIVE METABOLIC PANEL
A/G RATIO: 2.1 (ref 1.2–2.2)
ALBUMIN: 5 g/dL (ref 3.5–5.5)
ALT: 20 IU/L (ref 0–44)
AST: 28 IU/L (ref 0–40)
Alkaline Phosphatase: 63 IU/L (ref 39–117)
BILIRUBIN TOTAL: 0.5 mg/dL (ref 0.0–1.2)
BUN / CREAT RATIO: 15 (ref 9–20)
BUN: 17 mg/dL (ref 6–24)
CHLORIDE: 97 mmol/L (ref 96–106)
CO2: 25 mmol/L (ref 20–29)
Calcium: 10 mg/dL (ref 8.7–10.2)
Creatinine, Ser: 1.14 mg/dL (ref 0.76–1.27)
GFR calc Af Amer: 86 mL/min/{1.73_m2} (ref >59)
GFR calc non Af Amer: 74 mL/min/{1.73_m2} (ref >59)
Globulin, Total: 2.4 g/dL (ref 1.5–4.5)
Glucose: 78 mg/dL (ref 65–99)
POTASSIUM: 4.7 mmol/L (ref 3.5–5.2)
SODIUM: 139 mmol/L (ref 134–144)
Total Protein: 7.4 g/dL (ref 6.0–8.5)

## 2018-01-29 LAB — LIPID PANEL
Chol/HDL Ratio: 3.5 ratio (ref 0.0–5.0)
Cholesterol, Total: 306 mg/dL — ABNORMAL HIGH (ref 100–199)
HDL: 87 mg/dL (ref >39)
LDL Calculated: 200 mg/dL — ABNORMAL HIGH (ref 0–99)
Triglycerides: 93 mg/dL (ref 0–149)
VLDL Cholesterol Cal: 19 mg/dL (ref 5–40)

## 2020-01-06 ENCOUNTER — Other Ambulatory Visit: Payer: Self-pay

## 2020-01-06 ENCOUNTER — Ambulatory Visit (INDEPENDENT_AMBULATORY_CARE_PROVIDER_SITE_OTHER): Payer: 59 | Admitting: Family Medicine

## 2020-01-06 ENCOUNTER — Encounter: Payer: Self-pay | Admitting: Family Medicine

## 2020-01-06 VITALS — BP 110/78 | HR 48 | Temp 97.3°F | Ht 68.0 in | Wt 176.0 lb

## 2020-01-06 DIAGNOSIS — J301 Allergic rhinitis due to pollen: Secondary | ICD-10-CM | POA: Diagnosis not present

## 2020-01-06 DIAGNOSIS — Z8371 Family history of colonic polyps: Secondary | ICD-10-CM | POA: Diagnosis not present

## 2020-01-06 DIAGNOSIS — Z1159 Encounter for screening for other viral diseases: Secondary | ICD-10-CM

## 2020-01-06 DIAGNOSIS — E785 Hyperlipidemia, unspecified: Secondary | ICD-10-CM | POA: Diagnosis not present

## 2020-01-06 DIAGNOSIS — Z8616 Personal history of COVID-19: Secondary | ICD-10-CM

## 2020-01-06 DIAGNOSIS — Z0000 Encounter for general adult medical examination without abnormal findings: Secondary | ICD-10-CM

## 2020-01-06 LAB — CBC WITH DIFFERENTIAL/PLATELET
Hemoglobin: 15.5 g/dL (ref 13.0–17.7)
Lymphocytes Absolute: 2 10*3/uL (ref 0.7–3.1)
Lymphs: 36 %
RDW: 12.4 % (ref 11.6–15.4)

## 2020-01-06 LAB — COMPREHENSIVE METABOLIC PANEL: Potassium: 5 mmol/L (ref 3.5–5.2)

## 2020-01-06 LAB — LIPID PANEL

## 2020-01-06 NOTE — Progress Notes (Signed)
   Subjective:    Patient ID: Gregory Ali, male    DOB: 02-21-1966, 54 y.o.   MRN: [id number]  HPI He is here for complete examination.  He has enjoyed excellent health and does take good care of himself.  He remains quite physically active but had to cut back on his running due to knee pain.  He did have Covid and is now completely recovered.  He is not interested in the vaccine.  He also does not want a flu shot.  He does have a history of hyperlipidemia and also has allergies.  The allergies are not causing any difficulty.  He does not smoke and drinking is minimal.  He is in need of a colonoscopy this year.  He does have a family history of colon polyps.  His work and home life are going well.  Family and social history as well as health maintenance and immunizations was reviewed   Review of Systems  All other systems reviewed and are negative.      Objective:   Physical Exam  Alert and in no distress. Tympanic membranes and canals are normal. Pharyngeal area is normal. Neck is supple without adenopathy or thyromegaly. Cardiac exam shows a regular sinus rhythm without murmurs or gallops. Lungs are clear to auscultation.        Assessment & Plan:  Routine general medical examination at a health care facility - Plan: CBC with Differential/Platelet, Comprehensive metabolic panel, Lipid panel  Non-seasonal allergic rhinitis due to pollen  Hyperlipidemia with target LDL less than 130  Family history of colonic polyps - Plan: Ambulatory referral to Gastroenterology  Need for hepatitis C screening test - Plan: Hepatitis C antibody  History of COVID-19 I encouraged him to continue to take good care of himself.  Did offer the vaccine to him however he is not interested.

## 2020-01-07 LAB — CBC WITH DIFFERENTIAL/PLATELET
Basophils Absolute: 0 10*3/uL (ref 0.0–0.2)
Basos: 1 %
EOS (ABSOLUTE): 0.1 10*3/uL (ref 0.0–0.4)
Eos: 1 %
Hematocrit: 45.1 % (ref 37.5–51.0)
Immature Grans (Abs): 0 10*3/uL (ref 0.0–0.1)
Immature Granulocytes: 0 %
MCH: 31.8 pg (ref 26.6–33.0)
MCHC: 34.4 g/dL (ref 31.5–35.7)
MCV: 93 fL (ref 79–97)
Monocytes Absolute: 0.5 10*3/uL (ref 0.1–0.9)
Monocytes: 9 %
Neutrophils Absolute: 2.9 10*3/uL (ref 1.4–7.0)
Neutrophils: 53 %
Platelets: 248 10*3/uL (ref 150–450)
RBC: 4.87 x10E6/uL (ref 4.14–5.80)
WBC: 5.5 10*3/uL (ref 3.4–10.8)

## 2020-01-07 LAB — COMPREHENSIVE METABOLIC PANEL
ALT: 33 IU/L (ref 0–44)
AST: 42 IU/L — ABNORMAL HIGH (ref 0–40)
Albumin/Globulin Ratio: 2.3 — ABNORMAL HIGH (ref 1.2–2.2)
Alkaline Phosphatase: 68 IU/L (ref 44–121)
BUN: 18 mg/dL (ref 6–24)
Bilirubin Total: 1 mg/dL (ref 0.0–1.2)
CO2: 25 mmol/L (ref 20–29)
Calcium: 10.3 mg/dL — ABNORMAL HIGH (ref 8.7–10.2)
Chloride: 100 mmol/L (ref 96–106)
GFR calc non Af Amer: 76 mL/min/{1.73_m2} (ref >59)
Sodium: 141 mmol/L (ref 134–144)
Total Protein: 7.5 g/dL (ref 6.0–8.5)

## 2020-01-07 LAB — LIPID PANEL
Chol/HDL Ratio: 3.2 ratio (ref 0.0–5.0)
Cholesterol, Total: 346 mg/dL — ABNORMAL HIGH (ref 100–199)
HDL: 107 mg/dL (ref >39)
LDL Chol Calc (NIH): 235 mg/dL — ABNORMAL HIGH (ref 0–99)
Triglycerides: 48 mg/dL (ref 0–149)
VLDL Cholesterol Cal: 4 mg/dL — ABNORMAL LOW (ref 5–40)

## 2020-01-07 LAB — HEPATITIS C ANTIBODY: Hep C Virus Ab: <0.1 s/co ratio (ref 0.0–0.9)

## 2020-04-14 ENCOUNTER — Telehealth: Payer: Self-pay | Admitting: Gastroenterology

## 2020-04-14 ENCOUNTER — Other Ambulatory Visit: Payer: Self-pay

## 2020-04-14 ENCOUNTER — Ambulatory Visit (AMBULATORY_SURGERY_CENTER): Payer: 59 | Admitting: *Deleted

## 2020-04-14 VITALS — Ht 69.0 in | Wt 175.0 lb

## 2020-04-14 DIAGNOSIS — Z800 Family history of malignant neoplasm of digestive organs: Secondary | ICD-10-CM

## 2020-04-14 DIAGNOSIS — Z01818 Encounter for other preprocedural examination: Secondary | ICD-10-CM

## 2020-04-14 DIAGNOSIS — Z86010 Personal history of colonic polyps: Secondary | ICD-10-CM

## 2020-04-14 MED ORDER — PLENVU 140 G PO SOLR: 1.0000 | Freq: Once | ORAL | 0 refills | Status: AC

## 2020-04-14 NOTE — Telephone Encounter (Signed)
Called patient, he request to cancel his colonoscopy due to work and he will call us back when he is able to reschedule. I also cancelled his covid test at this time. Did not mail prep instructions to pt.-pt aware.

## 2020-04-14 NOTE — Progress Notes (Signed)
Patient's pre-visit was done today over the phone with the patient due to COVID-19 pandemic. Name,DOB and address verified. Insurance verified. Packet of Prep instructions mailed to patient including a copy of a consent form and pre-procedure patient acknowledgement form (with envelope to mail back to us)-pt is aware. [employer] Coupon included. Patient understands to call us back with any questions or concerns. COVID-19 test on 1/31 at 10 am,  Patient is aware. Notified patient of our Covid-19 safety protocols.

## 2020-04-14 NOTE — Telephone Encounter (Signed)
Pt is calling to reschedule his covid test 

## 2020-04-28 ENCOUNTER — Encounter: Payer: Managed Care, Other (non HMO) | Admitting: Gastroenterology

## 2021-04-13 DIAGNOSIS — Z0279 Encounter for issue of other medical certificate: Secondary | ICD-10-CM

## 2021-08-30 ENCOUNTER — Encounter: Payer: 59 | Admitting: Family Medicine

## 2021-11-04 ENCOUNTER — Ambulatory Visit (INDEPENDENT_AMBULATORY_CARE_PROVIDER_SITE_OTHER): Payer: 59 | Admitting: Family Medicine

## 2021-11-04 ENCOUNTER — Encounter: Payer: Self-pay | Admitting: Family Medicine

## 2021-11-04 VITALS — BP 110/70 | HR 46 | Temp 96.8°F | Ht 68.0 in | Wt 176.8 lb

## 2021-11-04 DIAGNOSIS — E785 Hyperlipidemia, unspecified: Secondary | ICD-10-CM | POA: Diagnosis not present

## 2021-11-04 DIAGNOSIS — J301 Allergic rhinitis due to pollen: Secondary | ICD-10-CM

## 2021-11-04 DIAGNOSIS — Z0000 Encounter for general adult medical examination without abnormal findings: Secondary | ICD-10-CM | POA: Diagnosis not present

## 2021-11-04 DIAGNOSIS — K439 Ventral hernia without obstruction or gangrene: Secondary | ICD-10-CM

## 2021-11-04 DIAGNOSIS — Z8371 Family history of colonic polyps: Secondary | ICD-10-CM

## 2021-11-04 DIAGNOSIS — Z23 Encounter for immunization: Secondary | ICD-10-CM | POA: Diagnosis not present

## 2021-11-04 DIAGNOSIS — Z2821 Immunization not carried out because of patient refusal: Secondary | ICD-10-CM

## 2021-11-04 NOTE — Patient Instructions (Signed)
Health Maintenance, Male Adopting a healthy lifestyle and getting preventive care are important in promoting health and wellness. Ask your health care provider about: The right schedule for you to have regular tests and exams. Things you can do on your own to prevent diseases and keep yourself healthy. What should I know about diet, weight, and exercise? Eat a healthy diet  Eat a diet that includes plenty of vegetables, fruits, low-fat dairy products, and lean protein. Do not eat a lot of foods that are high in solid fats, added sugars, or sodium. Maintain a healthy weight Body mass index (BMI) is a measurement that can be used to identify possible weight problems. It estimates body fat based on height and weight. Your health care provider can help determine your BMI and help you achieve or maintain a healthy weight. Get regular exercise Get regular exercise. This is one of the most important things you can do for your health. Most adults should: Exercise for at least 150 minutes each week. The exercise should increase your heart rate and make you sweat (moderate-intensity exercise). Do strengthening exercises at least twice a week. This is in addition to the moderate-intensity exercise. Spend less time sitting. Even light physical activity can be beneficial. Watch cholesterol and blood lipids Have your blood tested for lipids and cholesterol at 55 years of age, then have this test every 5 years. You may need to have your cholesterol levels checked more often if: Your lipid or cholesterol levels are high. You are older than 55 years of age. You are at high risk for heart disease. What should I know about cancer screening? Many types of cancers can be detected early and may often be prevented. Depending on your health history and family history, you may need to have cancer screening at various ages. This may include screening for: Colorectal cancer. Prostate cancer. Skin cancer. Lung  cancer. What should I know about heart disease, diabetes, and high blood pressure? Blood pressure and heart disease High blood pressure causes heart disease and increases the risk of stroke. This is more likely to develop in people who have high blood pressure readings or are overweight. Talk with your health care provider about your target blood pressure readings. Have your blood pressure checked: Every 3-5 years if you are 18-39 years of age. Every year if you are 40 years old or older. If you are between the ages of 65 and 75 and are a current or former smoker, ask your health care provider if you should have a one-time screening for abdominal aortic aneurysm (AAA). Diabetes Have regular diabetes screenings. This checks your fasting blood sugar level. Have the screening done: Once every three years after age 45 if you are at a normal weight and have a low risk for diabetes. More often and at a younger age if you are overweight or have a high risk for diabetes. What should I know about preventing infection? Hepatitis B If you have a higher risk for hepatitis B, you should be screened for this virus. Talk with your health care provider to find out if you are at risk for hepatitis B infection. Hepatitis C Blood testing is recommended for: Everyone born from 1945 through 1965. Anyone with known risk factors for hepatitis C. Sexually transmitted infections (STIs) You should be screened each year for STIs, including gonorrhea and chlamydia, if: You are sexually active and are younger than 55 years of age. You are older than 55 years of age and your   health care provider tells you that you are at risk for this type of infection. Your sexual activity has changed since you were last screened, and you are at increased risk for chlamydia or gonorrhea. Ask your health care provider if you are at risk. Ask your health care provider about whether you are at high risk for HIV. Your health care provider  may recommend a prescription medicine to help prevent HIV infection. If you choose to take medicine to prevent HIV, you should first get tested for HIV. You should then be tested every 3 months for as long as you are taking the medicine. Follow these instructions at home: Alcohol use Do not drink alcohol if your health care provider tells you not to drink. If you drink alcohol: Limit how much you have to 0-2 drinks a day. Know how much alcohol is in your drink. In the [place] one drink equals one 12 oz bottle of beer (355 mL), one 5 oz glass of wine (148 mL), or one 1 oz glass of hard liquor (44 mL). Lifestyle Do not use any products that contain nicotine or tobacco. These products include cigarettes, chewing tobacco, and vaping devices, such as e-cigarettes. If you need help quitting, ask your health care provider. Do not use street drugs. Do not share needles. Ask your health care provider for help if you need support or information about quitting drugs. General instructions Schedule regular health, dental, and eye exams. Stay current with your vaccines. Tell your health care provider if: You often feel depressed. You have ever been abused or do not feel safe at home. Summary Adopting a healthy lifestyle and getting preventive care are important in promoting health and wellness. Follow your health care provider's instructions about healthy diet, exercising, and getting tested or screened for diseases. Follow your health care provider's instructions on monitoring your cholesterol and blood pressure. This information is not intended to replace advice given to you by your health care provider. Make sure you discuss any questions you have with your health care provider. Document Revised: 08/02/2020 Document Reviewed: 08/02/2020 Elsevier Patient Education  2023 [employer]  

## 2021-11-04 NOTE — Progress Notes (Signed)
Complete physical exam  Patient: Gregory Ali   DOB: 1966-03-10   56 y.o. Male  MRN: [id number]  Subjective:    Gregory Ali is a 56 y.o. male who presents today for a complete physical exam. He reports consuming a general diet. Home exercise routine includes weights , run  and bike riding . Gym/ health club routine includes cardio, jogging on track , mod to heavy weightlifting, and stationary bike. He generally feels well. He reports sleeping poorly. He does have a ventral hernia that usually only gives him trouble when he works out.  Otherwise he has no particular concerns or complaints.  He is about to retire.  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  At this time he has no thoughts on what he plans to do with the rest of his life but is aware of the fact that he needs to look into this.  He does have underlying allergies that cause very little difficulty.  Also has a family history of colonic polyps and is behind on getting his colonoscopy.  Encouraged him to go ahead and set that up.  He has 2 children that are away at college.  His marriage is quite strong.  Family and social history as well as health maintenance and immunizations was reviewed   Most recent fall risk assessment:     No data to display           Most recent depression screenings:    11/04/2021    2:38 PM 01/06/2020    2:15 PM  PHQ 2/9 Scores  PHQ - 2 Score 0 0      Patient Active Problem List   Diagnosis Date Noted   History of COVID-19 01/06/2020   Family history of colonic polyps 03/01/2015   Allergic rhinitis due to pollen 03/10/2011   Hyperlipidemia with target LDL less than 130 03/10/2011   Past Medical History:  Diagnosis Date   Allergic rhinitis    Dyslipidemia    Past Surgical History:  Procedure Laterality Date   BACK SURGERY  2001   COLONOSCOPY  05/18/2014   Arlyce Dice    POLYPECTOMY     SPINE SURGERY     VASECTOMY  2005   Social History    Tobacco Use   Smoking status: Never   Smokeless tobacco: Never  Vaping Use   Vaping Use: Never used  Substance Use Topics   Alcohol use: Yes    Alcohol/week: 10.0 standard drinks of alcohol    Types: 10 Glasses of wine per week   Drug use: No   Family History  Problem Relation Age of Onset   Hyperlipidemia Mother    Hypertension Mother    Colon polyps Mother    Diverticulitis Mother    Hyperlipidemia Father    Hypertension Father    Colon polyps Sister    Colon cancer Maternal Grandmother        all grandparents dx age 35-80's   Colon cancer Maternal Grandfather    Colon cancer Paternal Grandmother    Colon cancer Paternal Grandfather    Stomach cancer Neg Hx    Esophageal cancer Neg Hx    Rectal cancer Neg Hx    Allergies  Allergen Reactions   Shellfish Allergy Other (See Comments)    Per allergy testing per pt.      Patient Care Team: Ronnald Nian, MD as PCP - General [facility])   Outpatient  Medications Prior to Visit  Medication Sig   Ascorbic Acid (VITAMIN C PO) Take by mouth.   Multiple Vitamins-Minerals (MULTIVITAMIN WITH MINERALS) tablet Take 1 tablet by mouth daily.   VITAMIN D PO Take by mouth.   fluorometholone (FML) 0.1 % ophthalmic suspension Place 1 drop into the right eye 4 (four) times daily.   No facility-administered medications prior to visit.    Review of Systems  All other systems reviewed and are negative.         Objective:     BP 110/70   Pulse (!) 46   Temp (!) 96.8 F (36 C)   Ht 5\' 8"  (1.727 m)   Wt 176 lb 12.8 oz (80.2 kg)   SpO2 98%   BMI 26.88 kg/m  BP Readings from Last 3 Encounters:  11/04/21 110/70  01/06/20 110/78  01/28/18 104/70   Wt Readings from Last 3 Encounters:  11/04/21 176 lb 12.8 oz (80.2 kg)  04/14/20 175 lb (79.4 kg)  01/06/20 176 lb (79.8 kg)      Physical Exam  Alert and in no distress. Tympanic membranes and canals are normal. Pharyngeal area is normal. Neck is supple  without adenopathy or thyromegaly. Cardiac exam shows a regular sinus rhythm without murmurs or gallops. Lungs are clear to auscultation.  Last CBC Lab Results  Component Value Date   WBC 5.5 01/06/2020   HGB 15.5 01/06/2020   HCT 45.1 01/06/2020   MCV 93 01/06/2020   MCH 31.8 01/06/2020   RDW 12.4 01/06/2020   PLT 248 01/06/2020   Last metabolic panel Lab Results  Component Value Date   GLUCOSE 89 01/06/2020   NA 141 01/06/2020   K 5.0 01/06/2020   CL 100 01/06/2020   CO2 25 01/06/2020   BUN 18 01/06/2020   CREATININE 1.10 01/06/2020   GFRNONAA 76 01/06/2020   CALCIUM 10.3 (H) 01/06/2020   PROT 7.5 01/06/2020   ALBUMIN 5.2 (H) 01/06/2020   LABGLOB 2.3 01/06/2020   AGRATIO 2.3 (H) 01/06/2020   BILITOT 1.0 01/06/2020   ALKPHOS 68 01/06/2020   AST 42 (H) 01/06/2020   ALT 33 01/06/2020   Last lipids Lab Results  Component Value Date   CHOL 346 (H) 01/06/2020   HDL 107 01/06/2020   LDLCALC 235 (H) 01/06/2020   TRIG 48 01/06/2020   CHOLHDL 3.2 01/06/2020   Last vitamin D No results found for: "25OHVITD2", "25OHVITD3", "VD25OH"      Assessment & Plan:    Routine general medical examination at a health care facility - Plan: CBC with Differential/Platelet, Comprehensive metabolic panel, Lipid panel  Non-seasonal allergic rhinitis due to pollen  Hyperlipidemia with target LDL less than 130 - Plan: Lipid panel  Family history of colonic polyps  Need for Tdap vaccination - Plan: Tdap vaccine greater than or equal to 7yo IM  Immunization refused  Ventral hernia without obstruction or gangrene  Immunization History  Administered Date(s) Administered   DT (Pediatric) 07/06/2000   Influenza Split 02/24/2012   Tdap 03/10/2011    Health Maintenance  Topic Date Due   COLONOSCOPY (Pts 45-77yrs Insurance coverage will need to be confirmed)  05/19/2019   TETANUS/TDAP  03/09/2021   Zoster Vaccines- Shingrix (1 of 2) 02/04/2022 (Originally 12/07/2016)   INFLUENZA  VACCINE  06/25/2022 (Originally 10/25/2021)   HIV Screening  11/05/2022 (Originally 12/07/1981)   Hepatitis C Screening  Completed   HPV VACCINES  Aged Out    Discussed health benefits of physical activity, and encouraged  him to engage in regular exercise appropriate for his age and condition.  Also discussed immunizations with him and he is not interested in having further immunizations including COVID.  Discussed his retirement with him in terms of intellectual, psychological and social parameters  Problem List Items Addressed This Visit     Allergic rhinitis due to pollen   Family history of colonic polyps   Hyperlipidemia with target LDL less than 130   Relevant Orders   Lipid panel   Other Visit Diagnoses     Routine general medical examination at a health care facility    -  Primary   Relevant Orders   CBC with Differential/Platelet   Comprehensive metabolic panel   Lipid panel   Need for Tdap vaccination       Relevant Orders   Tdap vaccine greater than or equal to 7yo IM   Immunization refused          Return in about 1 year (around 11/05/2022) for cpe .     Sharlot Gowda, MD

## 2021-11-05 LAB — CBC WITH DIFFERENTIAL/PLATELET
Basophils Absolute: 0 10*3/uL (ref 0.0–0.2)
Basos: 1 %
EOS (ABSOLUTE): 0 10*3/uL (ref 0.0–0.4)
Eos: 0 %
Hematocrit: 44 % (ref 37.5–51.0)
Hemoglobin: 15.6 g/dL (ref 13.0–17.7)
Immature Grans (Abs): 0 10*3/uL (ref 0.0–0.1)
Immature Granulocytes: 0 %
Lymphocytes Absolute: 2 10*3/uL (ref 0.7–3.1)
Lymphs: 34 %
MCH: 33.6 pg — ABNORMAL HIGH (ref 26.6–33.0)
MCHC: 35.5 g/dL (ref 31.5–35.7)
MCV: 95 fL (ref 79–97)
Monocytes Absolute: 0.4 10*3/uL (ref 0.1–0.9)
Monocytes: 7 %
Neutrophils Absolute: 3.4 10*3/uL (ref 1.4–7.0)
Neutrophils: 58 %
Platelets: 249 10*3/uL (ref 150–450)
RBC: 4.64 x10E6/uL (ref 4.14–5.80)
RDW: 12.6 % (ref 11.6–15.4)
WBC: 5.8 10*3/uL (ref 3.4–10.8)

## 2021-11-05 LAB — COMPREHENSIVE METABOLIC PANEL
ALT: 26 IU/L (ref 0–44)
AST: 36 IU/L (ref 0–40)
Albumin/Globulin Ratio: 2.3 — ABNORMAL HIGH (ref 1.2–2.2)
Albumin: 5.1 g/dL — ABNORMAL HIGH (ref 3.8–4.9)
Alkaline Phosphatase: 84 IU/L (ref 44–121)
BUN/Creatinine Ratio: 12 (ref 9–20)
BUN: 13 mg/dL (ref 6–24)
Bilirubin Total: 0.8 mg/dL (ref 0.0–1.2)
CO2: 25 mmol/L (ref 20–29)
Calcium: 10.2 mg/dL (ref 8.7–10.2)
Chloride: 102 mmol/L (ref 96–106)
Creatinine, Ser: 1.08 mg/dL (ref 0.76–1.27)
Globulin, Total: 2.2 g/dL (ref 1.5–4.5)
Glucose: 90 mg/dL (ref 70–99)
Potassium: 4.9 mmol/L (ref 3.5–5.2)
Sodium: 143 mmol/L (ref 134–144)
Total Protein: 7.3 g/dL (ref 6.0–8.5)
eGFR: 82 mL/min/{1.73_m2} (ref >59)

## 2021-11-05 LAB — LIPID PANEL
Chol/HDL Ratio: 3 ratio (ref 0.0–5.0)
Cholesterol, Total: 281 mg/dL — ABNORMAL HIGH (ref 100–199)
HDL: 94 mg/dL (ref >39)
LDL Chol Calc (NIH): 178 mg/dL — ABNORMAL HIGH (ref 0–99)
Triglycerides: 61 mg/dL (ref 0–149)
VLDL Cholesterol Cal: 9 mg/dL (ref 5–40)

## 2022-11-10 ENCOUNTER — Encounter: Payer: 59 | Admitting: Family Medicine

## 2023-05-22 ENCOUNTER — Encounter: Payer: Self-pay | Admitting: Internal Medicine
